# Patient Record
Sex: Male | Born: 2005 | Race: White | Hispanic: No | Marital: Single | Smoking: Never smoker
Health system: Southern US, Community
[De-identification: ages and names within clinical notes are randomized; demographics above are authoritative.]

## PROBLEM LIST (undated history)

## (undated) DIAGNOSIS — F988 Other specified behavioral and emotional disorders with onset usually occurring in childhood and adolescence: Secondary | ICD-10-CM

## (undated) DIAGNOSIS — J309 Allergic rhinitis, unspecified: Secondary | ICD-10-CM

## (undated) DIAGNOSIS — T7840XA Allergy, unspecified, initial encounter: Secondary | ICD-10-CM

## (undated) DIAGNOSIS — J45909 Unspecified asthma, uncomplicated: Secondary | ICD-10-CM

## (undated) HISTORY — DX: Other specified behavioral and emotional disorders with onset usually occurring in childhood and adolescence: F98.8

## (undated) HISTORY — DX: Allergic rhinitis, unspecified: J30.9

## (undated) HISTORY — DX: Unspecified asthma, uncomplicated: J45.909

## (undated) HISTORY — DX: Allergy, unspecified, initial encounter: T78.40XA

---
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
## 2024-01-10 ENCOUNTER — Other Ambulatory Visit: Payer: Self-pay | Admitting: Internal Medicine

## 2024-01-11 NOTE — Telephone Encounter (Signed)
 LMTCB. Need to find out if pt is still taking this medication and if so did he request the refill request.

## 2024-01-22 NOTE — Telephone Encounter (Signed)
 Called and left VM for patient to return call to office concerning refill request.

## 2024-03-17 MED ORDER — TIZANIDINE HCL 4 MG PO TABS: 4.0000 mg | ORAL_TABLET | Freq: Every day | ORAL | 0 refills | Status: DC

## 2024-03-17 NOTE — Addendum Note (Signed)
 Addended by: MARYLYNN VERNEITA CROME on: 03/17/2024 02:46 PM   Modules accepted: Orders

## 2024-04-19 ENCOUNTER — Other Ambulatory Visit: Payer: Self-pay | Admitting: Internal Medicine

## 2024-05-03 ENCOUNTER — Other Ambulatory Visit: Payer: Self-pay | Admitting: Internal Medicine

## 2024-05-03 NOTE — Telephone Encounter (Signed)
 Copied from CRM [id number]. Topic: Clinical - Medication Refill >> May 03, 2024 10:14 AM Shereese L wrote: Medication: lisdexamfetamine (VYVANSE ) 20 MG capsule  Has the patient contacted their pharmacy? Yes (Agent: If no, request that the patient contact the pharmacy for the refill. If patient does not wish to contact the pharmacy document the reason why and proceed with request.) (Agent: If yes, when and what did the pharmacy advise?)  This is the patient's preferred pharmacy:  [id number] [address] - [address] [place] [address] Phone: [phone] Fax: [phone]  Is this the correct pharmacy for this prescription? Yes If no, delete pharmacy and type the correct one.   Has the prescription been filled recently? Yes  Is the patient out of the medication? Yes  Has the patient been seen for an appointment in the last year OR does the patient have an upcoming appointment? Yes  Can we respond through MyChart? Yes  Agent: Please be advised that Rx refills may take up to 3 business days. We ask that you follow-up with your pharmacy.

## 2024-05-03 NOTE — Telephone Encounter (Signed)
 Copied from CRM [id number]. Topic: Clinical - Medication Refill >> May 03, 2024 10:17 AM Shereese L wrote: Medication: albuterol (VENTOLIN HFA) 108 (90 Base) MCG/ACT inhaler  Has the patient contacted their pharmacy? Yes (Agent: If no, request that the patient contact the pharmacy for the refill. If patient does not wish to contact the pharmacy document the reason why and proceed with request.) (Agent: If yes, when and what did the pharmacy advise?)  This is the patient's preferred pharmacy:  [id number] - [address] [place] [address] Phone: [phone] Fax: [phone]  Is this the correct pharmacy for this prescription? Yes If no, delete pharmacy and type the correct one.   Has the prescription been filled recently? Yes  Is the patient out of the medication? Yes  Has the patient been seen for an appointment in the last year OR does the patient have an upcoming appointment? Yes  Can we respond through MyChart? Yes  Agent: Please be advised that Rx refills may take up to 3 business days. We ask that you follow-up with your pharmacy.

## 2024-05-04 MED ORDER — ALBUTEROL SULFATE HFA 108 (90 BASE) MCG/ACT IN AERS: 1.0000 | INHALATION_SPRAY | Freq: Four times a day (QID) | RESPIRATORY_TRACT | 1 refills | Status: AC | PRN

## 2024-05-04 MED ORDER — LISDEXAMFETAMINE DIMESYLATE 20 MG PO CAPS: 20.0000 mg | ORAL_CAPSULE | Freq: Every morning | ORAL | 0 refills | Status: DC

## 2024-06-23 ENCOUNTER — Encounter: Payer: Self-pay | Admitting: Internal Medicine

## 2024-06-24 ENCOUNTER — Other Ambulatory Visit: Payer: Self-pay | Admitting: Internal Medicine

## 2024-06-24 MED ORDER — LISDEXAMFETAMINE DIMESYLATE 20 MG PO CAPS: 20.0000 mg | ORAL_CAPSULE | Freq: Every morning | ORAL | 0 refills | Status: DC

## 2024-06-24 NOTE — Telephone Encounter (Signed)
  Last Visit: 12/14/2023 Next Visit: 07/08/2024 Last Refill: E-Prescribing Status: Receipt confirmed by pharmacy (05/04/2024 11:08 AM EDT)   Please Advise

## 2024-07-08 ENCOUNTER — Ambulatory Visit: Admitting: Internal Medicine

## 2024-08-26 ENCOUNTER — Encounter: Payer: Self-pay | Admitting: Internal Medicine

## 2024-08-30 ENCOUNTER — Telehealth: Payer: Self-pay | Admitting: Internal Medicine

## 2024-08-30 MED ORDER — LISDEXAMFETAMINE DIMESYLATE 20 MG PO CAPS: 20.0000 mg | ORAL_CAPSULE | Freq: Every morning | ORAL | 0 refills | Status: AC

## 2024-08-30 NOTE — Telephone Encounter (Signed)
 Noted

## 2024-08-30 NOTE — Telephone Encounter (Signed)
 Non-Opioid Controlled Substance Agreement signed and scanned 1.16.2026   Requesting: isdexamfetamine (VYVANSE ) 20 MG  Contract: Yes UDS: No Last Visit: 12/14/2023 Next Visit: 08/30/2024 Last Refill: 06/24/24  Please Advise

## 2024-08-30 NOTE — Telephone Encounter (Signed)
 Non-Opioid Controlled Substance Agreement signed and scanned 1.16.2026

## 2024-09-06 ENCOUNTER — Ambulatory Visit: Admitting: Internal Medicine

## 2024-10-11 ENCOUNTER — Ambulatory Visit: Admitting: Internal Medicine
